# Patient Record
Sex: Female | Born: 1950 | Race: White | Hispanic: No | Marital: Married
Health system: Midwestern US, Community
[De-identification: ages and names within clinical notes are randomized; demographics above are authoritative.]

## PROBLEM LIST (undated history)

## (undated) DIAGNOSIS — G588 Other specified mononeuropathies: Secondary | ICD-10-CM

---

## 2017-04-20 NOTE — Telephone Encounter (Signed)
LM with patient in regard to rescheduling her appt with Dr. Margaretha Glassingonway on 05/30/17 at 1:00pm. Advised patient to call the office back as soon as possible.

## 2017-04-25 NOTE — Telephone Encounter (Signed)
Patient returned my call and rescheduled her appt to 06/05/17 at 2:30pm with Dr. Margaretha Glassingonway.

## 2017-05-30 ENCOUNTER — Encounter: Attending: Obstetrics & Gynecology

## 2017-07-12 ENCOUNTER — Ambulatory Visit: Attending: Obstetrics & Gynecology

## 2017-07-12 ENCOUNTER — Ambulatory Visit: Admit: 2017-07-12 | Discharge: 2017-07-12 | Payer: MEDICARE | Attending: Obstetrics & Gynecology

## 2017-07-12 DIAGNOSIS — G588 Other specified mononeuropathies: Secondary | ICD-10-CM

## 2017-07-12 NOTE — Progress Notes (Signed)
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  Patient was seen by me for consultation in 2016.  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  She had seen Dr. 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  She had multiple injections with no significant benefit.  Her pain started approximately 5 and half years ago.  Onset was somewhat spontaneous.  She notes pain with sitting.  She also notes some intermittent PGAD type symptoms.  Episodes of vaginal pain. History of urinary urgency and frequency which is chronic but increased over the last few years.  She has a chronically low libido and avoids sexual activity both for pain and other reasons.  The patient essentially has not responded to therapy.    Past Medical History:   Diagnosis Date   ??? Anemia    ??? Anxiety    ??? Coccygodynia    ??? Depression    ??? Migraines    ??? OCD (obsessive compulsive disorder)    ??? Pudendal neuralgia        Past Surgical History:   Procedure Laterality Date   ??? HX CORONARY ARTERY BYPASS GRAFT  1998   ??? HX OTHER SURGICAL Right 2018    Right foot big toe joint    ??? HX OTHER SURGICAL      Dental Implants       Physical exam:     General:  The patient appears uncomfortable sitting     Breast:  Deferred     Abdomen:  Soft, flat, nontender, no masses palpated, no hernias noted     Back:  No spinal or sacral tenderness     External genitalia:  Minimal allodynia and point tenderness along the inferior pubic rami, no skin rolling sensitivity   Urethra:  Normal   Vagina:  Positive levator tenderness   Cervix:  Normal   Bimanual:  No masses palpated, no tenderness noted   Rectal:  Coccyx is minimally tender, both sacrospinous ligament complexes are moderately tender with an equivocal Tinel sign     Extremities:  No clubbing cyanosis or edema     Neuro:  Otherwise grossly  intact       Impression:  Patient with continued pudendal neuralgia    Plan:  I recommended the patient follow up with Dr. Esmeralda ArthurBarreveld for series of pudendal nerve blocks as well as to review medical pain management.  We also will consider further physical therapy and continued pudendal protection.   If the patient fails to respond she may have to consider nerve decompression surgery.

## 2017-07-12 NOTE — Progress Notes (Signed)
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  Patient was seen by me for consultation in 2016.  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  She had seen Dr. 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  She had multiple injections with no significant benefit.  Her pain started approximately 5 and half years ago.  Onset was somewhat spontaneous.  She notes pain with sitting.  She also notes some intermittent PGAD type symptoms.  Episodes of vaginal pain. History of urinary urgency and frequency which is chronic but increased over the last few years.  She has a chronically low libido and avoids sexual activity both for pain and other reasons.  The patient essentially has not responded to therapy.    Past Medical History:   Diagnosis Date   ??? Anemia    ??? Anxiety    ??? Coccygodynia    ??? Depression    ??? Migraines    ??? OCD (obsessive compulsive disorder)    ??? Pudendal neuralgia        Past Surgical History:   Procedure Laterality Date   ??? HX CORONARY ARTERY BYPASS GRAFT  1998   ??? HX OTHER SURGICAL Right 2018    Right foot big toe joint    ??? HX OTHER SURGICAL      Dental Implants       Physical exam:     General:  The patient appears uncomfortable sitting     Breast:  Deferred     Abdomen:  Soft, flat, nontender, no masses palpated, no hernias noted     Back:  No spinal or sacral tenderness     External genitalia:  Minimal allodynia and point tenderness along the inferior pubic rami, no skin rolling sensitivity   Urethra:  Normal   Vagina:  Positive levator tenderness   Cervix:  Normal   Bimanual:  No masses palpated, no tenderness noted   Rectal:  Coccyx is minimally tender, both sacrospinous ligament complexes are moderately tender with an equivocal Tinel sign     Extremities:  No clubbing cyanosis or edema      Neuro:  Otherwise grossly intact       Impression:  Patient with continued pudendal neuralgia    Plan:  I recommended the patient follow up with Dr. Esmeralda ArthurBarreveld for series of pudendal nerve blocks as well as to review medical pain management.  We also will consider further physical therapy and continued pudendal protection.   If the patient fails to respond she may have to consider nerve decompression surgery.

## 2017-07-12 NOTE — Patient Instructions (Signed)
Follow-up with Dr. Esmeralda ArthurBarreveld for pudendal nerve blocks as well as to consider medical pain management.  We may have to revisit physical therapy.

## 2017-09-04 NOTE — Telephone Encounter (Signed)
Pt calling states she was told by Dr Margaretha Glassingonway to schedule nerve block with Dr Esmeralda ArthurBarreveld. She was told she already had a diagnostic nerve block that was negative. Pt does not recall this. She would like to know what her next step should be.

## 2017-09-22 NOTE — Telephone Encounter (Signed)
I called the patient and left her a voicemail stating that I was trying to reach out to Dr. Esmeralda ArthurBarreveld to review the case.  Once  we have reviewed this we will make a plan for  your treatment   Going forward.

## 2017-12-18 ENCOUNTER — Encounter

## 2021-05-03 IMAGING — CT CT RIGHT FOOT WITHOUT CONTRAST
3 of 4 series · 13 of 35 positions shown, 16 images · non-contrast
Comparison: None

________________________________________________________________________________________________ 
CT RIGHT FOOT WITHOUT CONTRAST, 05/03/2021 [DATE]: 
CLINICAL INDICATION: Pain in right foot. Evaluate hallux. 
A search for DICOM formatted images was conducted for prior CT imaging studies 
completed at a non-affiliated media free facility.
TECHNIQUE: The right foot was scanned without contrast on a high resolution low 
dose CT scanner. Standard reconstructions were performed.

[Series 3: axial st · axial · 0.29mm/px · z∈[-212,-42]mm · 5 of 512 slices shown, 7 images]
[im 86/512  soft-tissue]
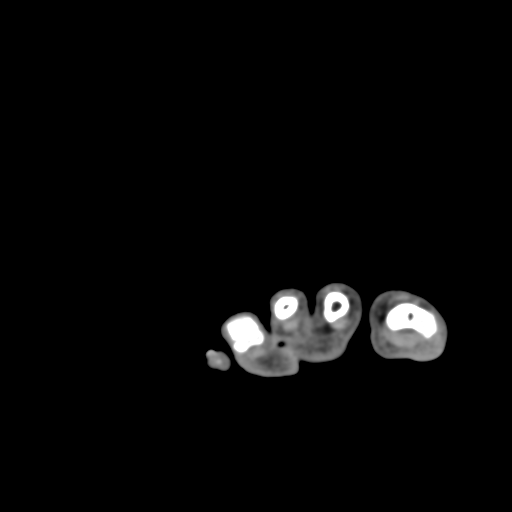
[im 86/512  bone]
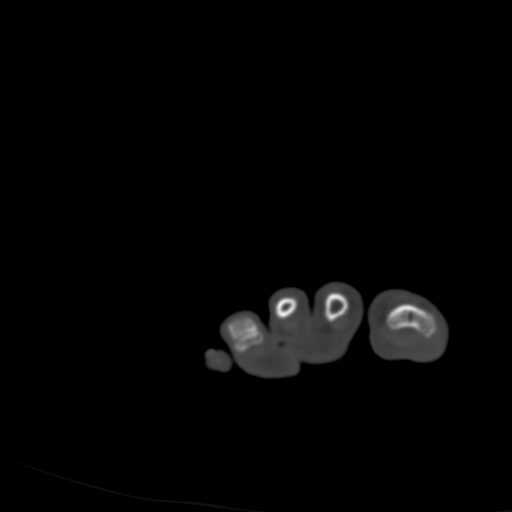
[im 171/512  bone]
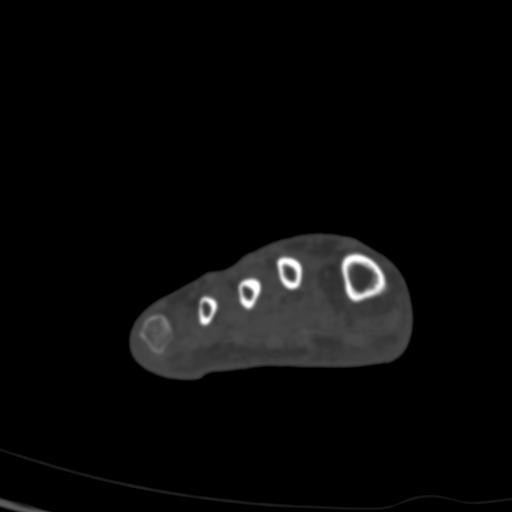
[im 256/512  bone]
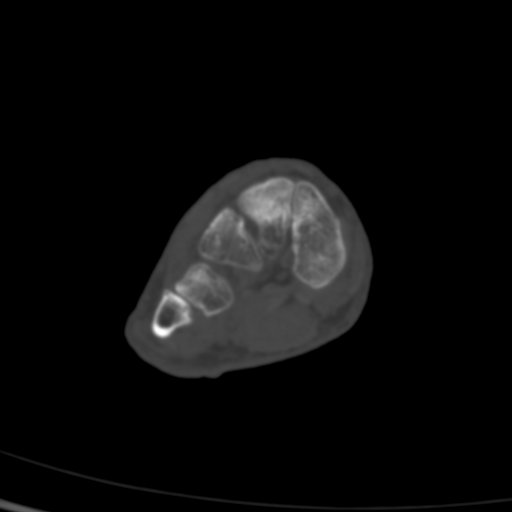
[im 341/512  bone]
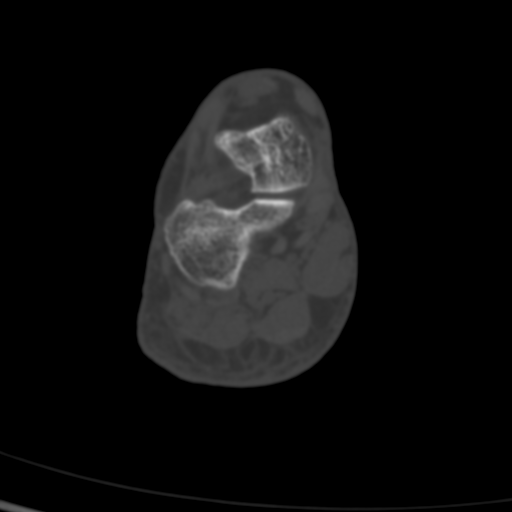
[im 426/512  soft-tissue]
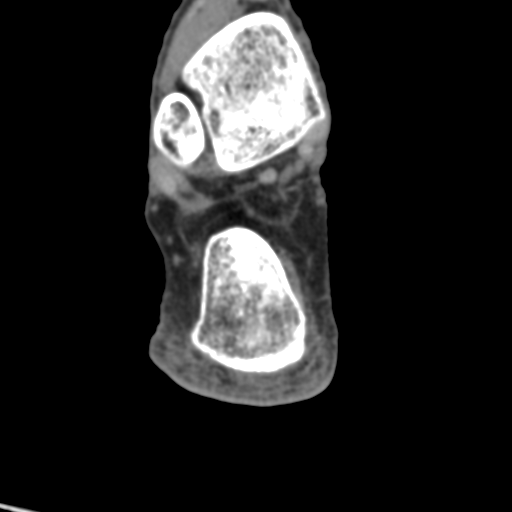
[im 426/512  bone]
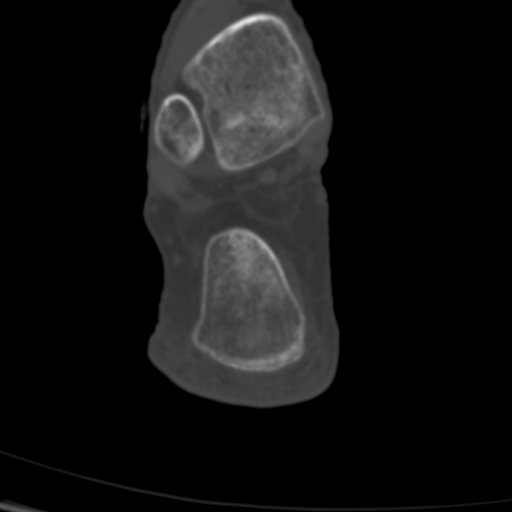

[Series 5: cor bone · coronal · 0.29mm/px · 3 of 154 slices shown]
[im 31/154  bone]
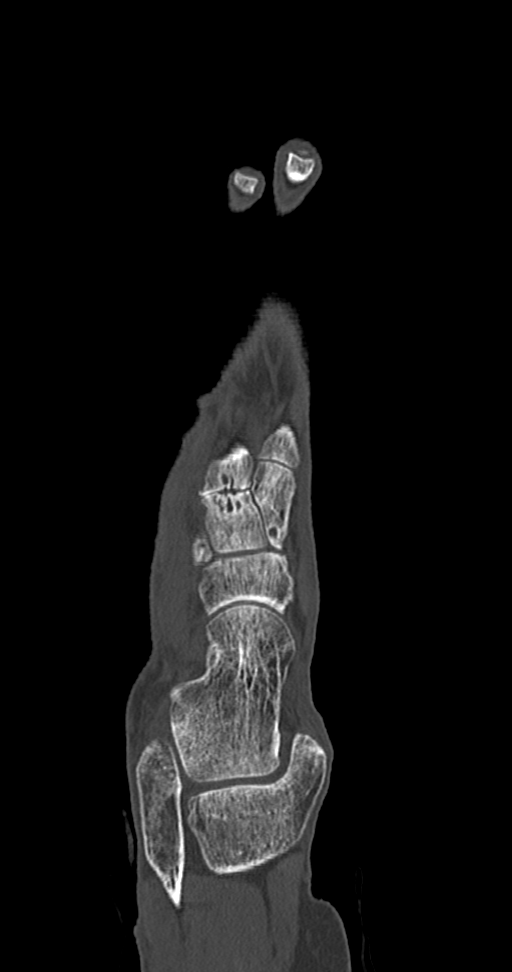
[im 62/154  bone]
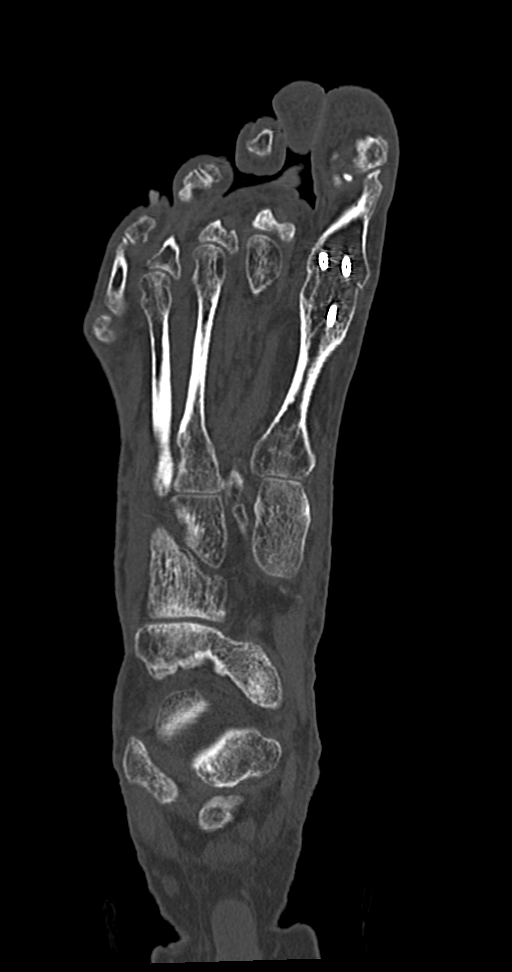
[im 92/154  bone]
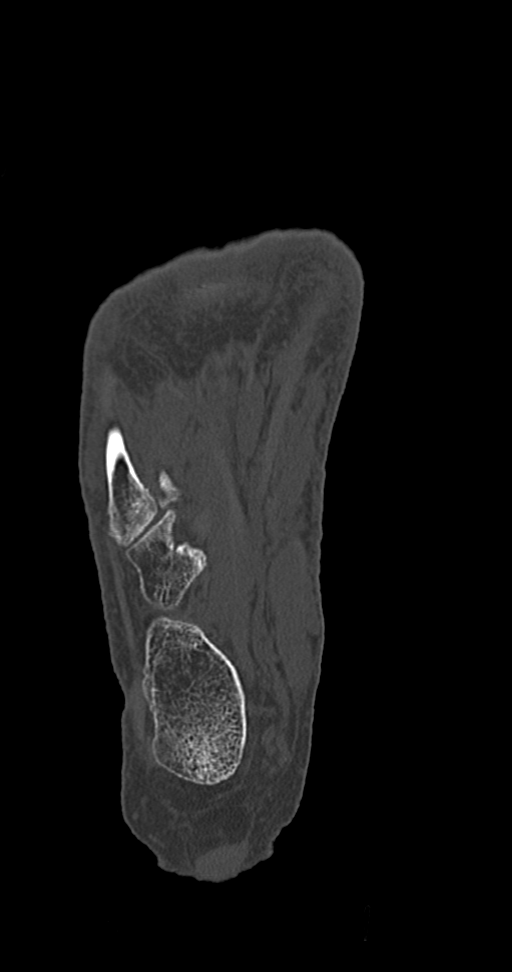

[Series 602: rt sag · sagittal · 0.50mm/px · 5 of 89 slices shown, 6 images]
[im 30/89  bone]
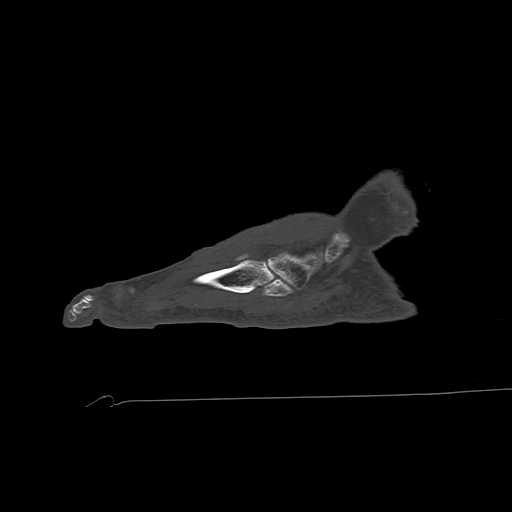
[im 37/89  bone]
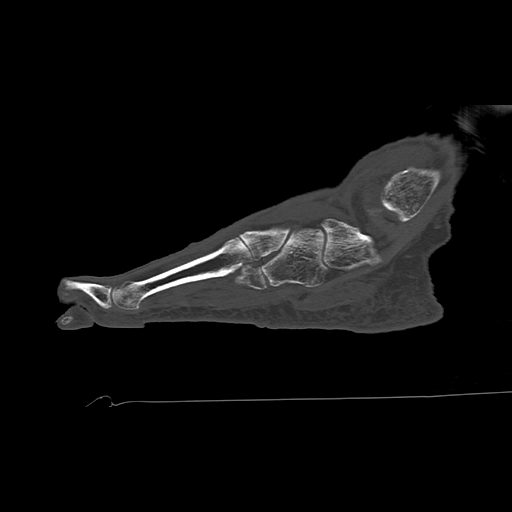
[im 45/89  soft-tissue]
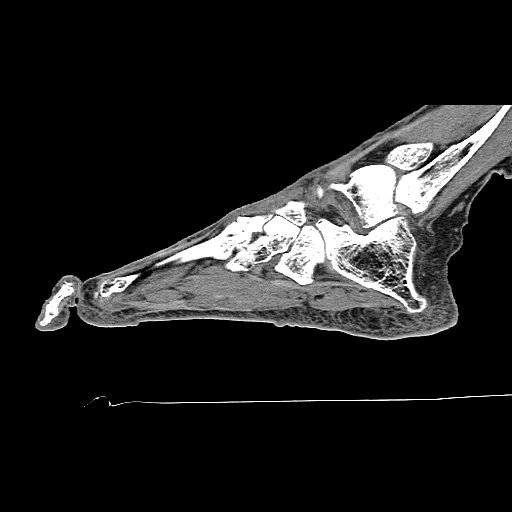
[im 45/89  bone]
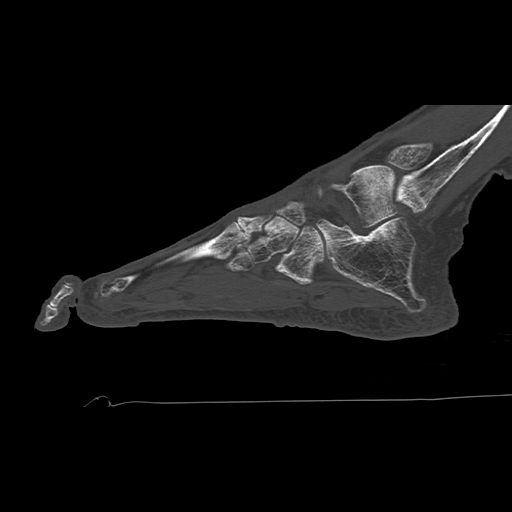
[im 52/89  bone]
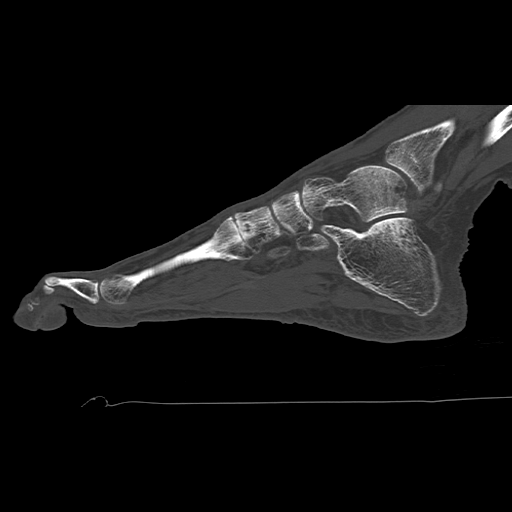
[im 59/89  bone]
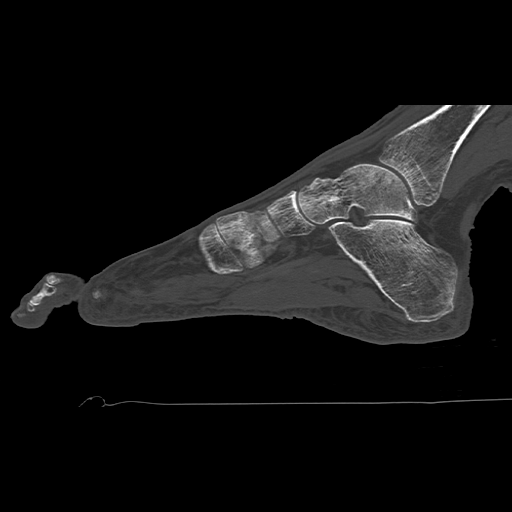

[13 of 35 positions shown; findings below may reference images not displayed]

FINDINGS: BONES: No fractures or CT evidence of osteomyelitis.  
JOINTS: Ankle and subtalar joints are preserved. Marked degenerative change of 
the second tarsometatarsal (TMT) joint with marked joint space narrowing and 
subcortical cysts. Moderate third and mild fourth TMT joint degenerative change. 
Subcortical cystic change of the proximal medial cuneiform. Hallux 
metatarsophalangeal (MTP) joint solid arthrodesis with 2 fixation screws. No 
hardware loosening. Degenerative change of the articulation between the hallux 
metatarsal head and tibial hallux sesamoid. Remaining MTP joints are preserved. 
Flexion of the lesser toes on nonweightbearing views. No interphalangeal (IP) 
joint space narrowing. 
SOFT TISSUES: Tiny calcifications within the distal Achilles tendon. No mass or 
fluid collections. No full-thickness tendon tear and tendon subluxation. 
Subcutaneous soft tissues are normal.
IMPRESSION: 1.  Hallux MTP joint solid arthrodesis with 2 fixation screws. 
2.  Multifocal degenerative change, most marked involving the second TMT joint.  
RADIATION DOSE REDUCTION: All CT scans are performed using radiation dose 
reduction techniques, when applicable. Technical factors are evaluated and 
adjusted to ensure appropriate moderation of exposure. Automated dose management 
technology is applied to adjust the radiation doses to minimize exposure while 
achieving diagnostic quality images.

## 2021-06-11 IMAGING — DX CERVICAL SPINE 4 VIEWS
1 series · 4 of 4 positions shown · non-contrast
Comparison: None

________________________________________________________________________________________________ 
CERVICAL SPINE 4 VIEWS, 06/11/2021 [DATE]: 
CLINICAL INDICATION: Intermittent left neck pain.

[Series 1: lateral · U · 0.14mm/px · 4 of 4 slices shown]
[im 1/4]
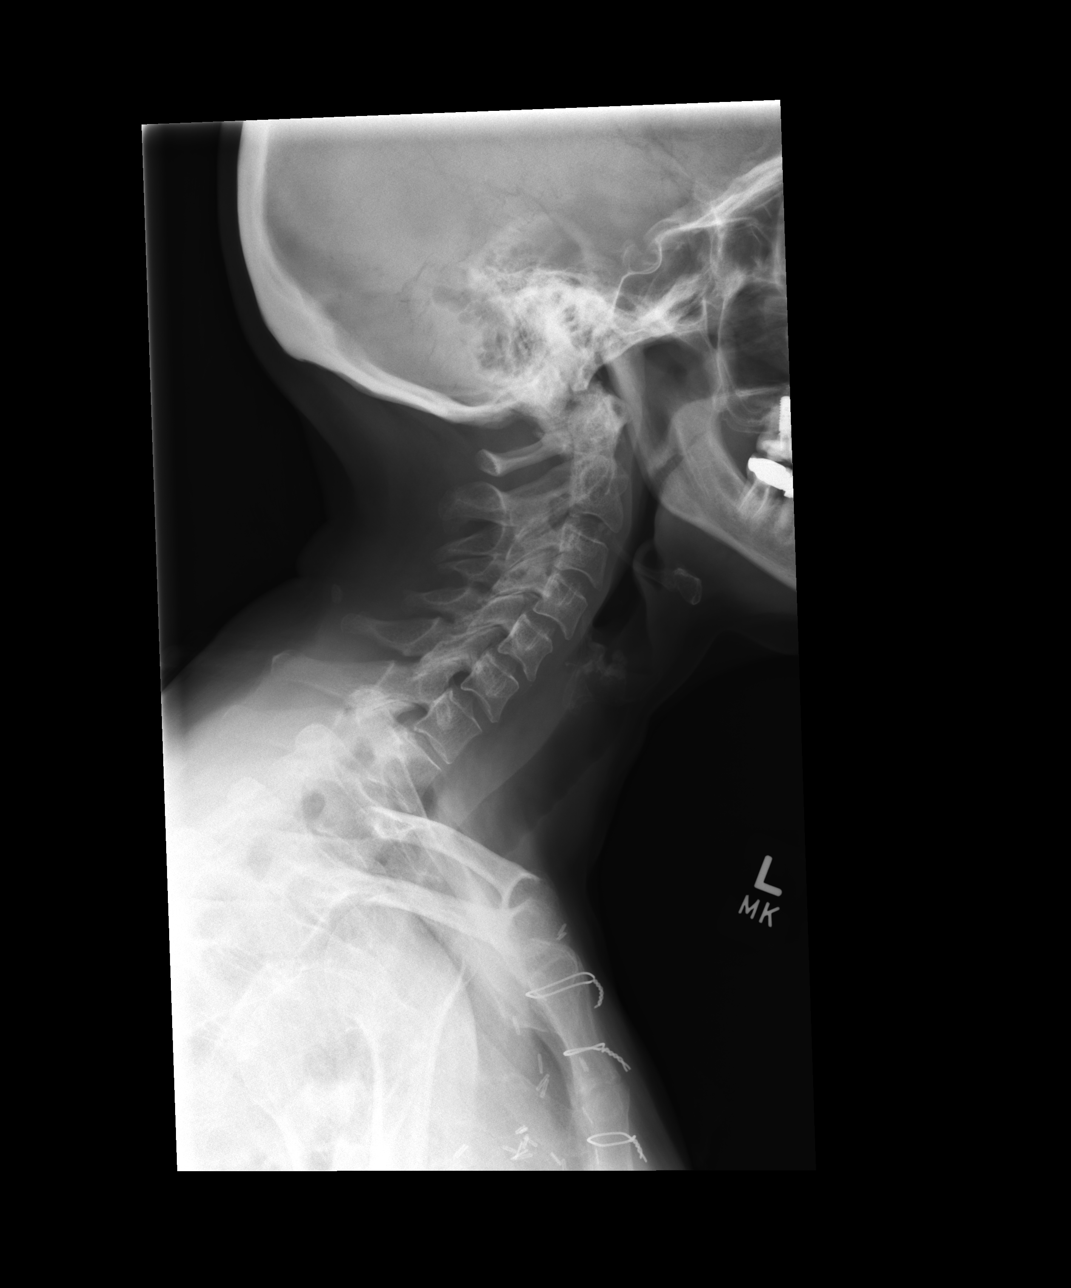
[im 2/4]
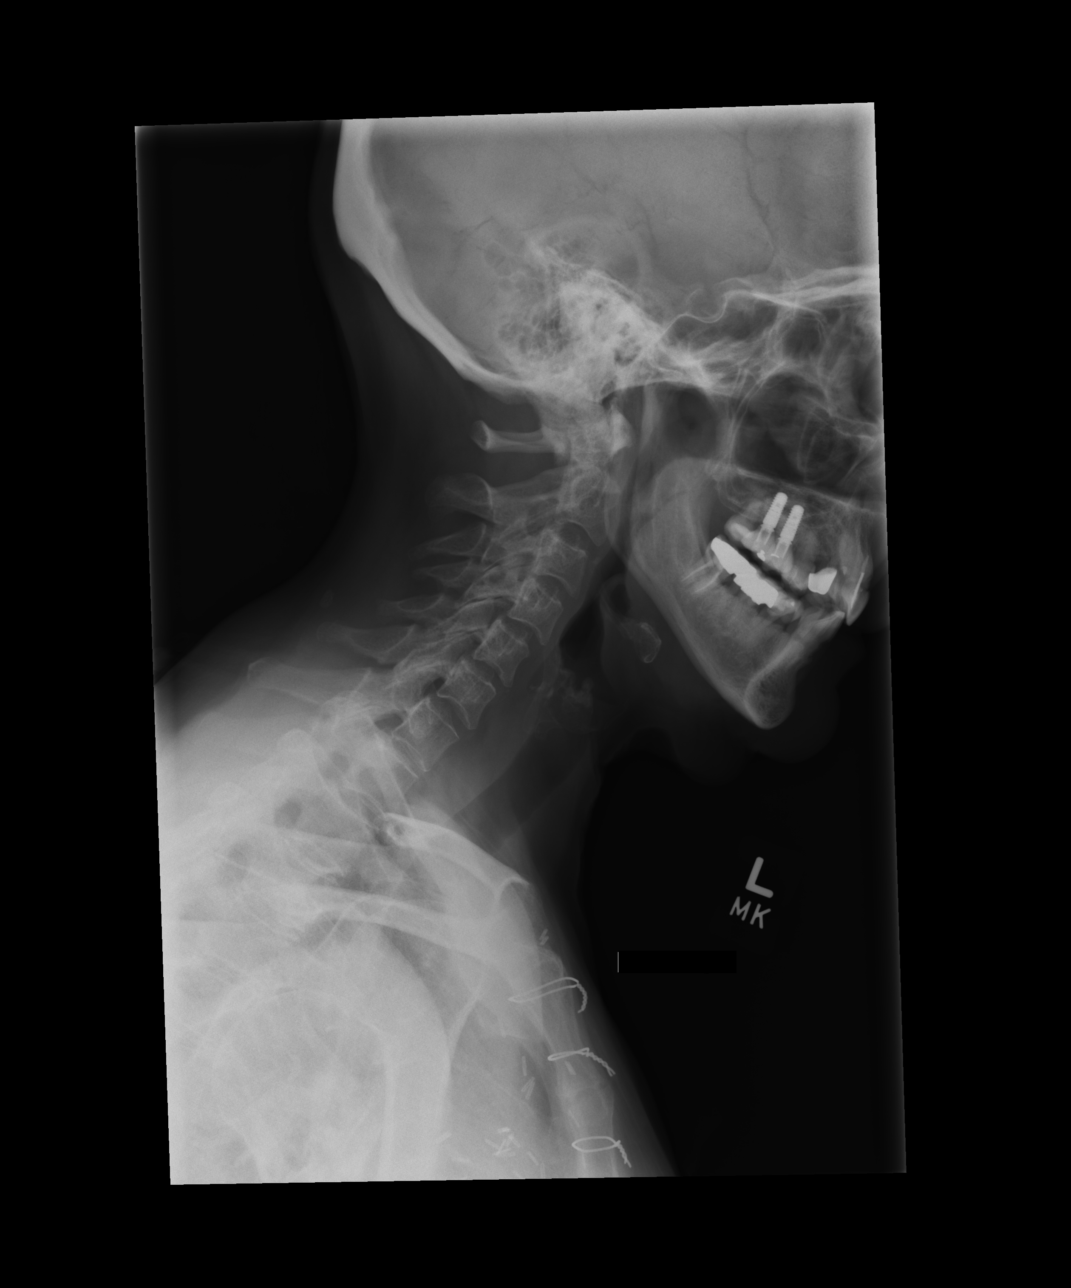
[im 3/4]
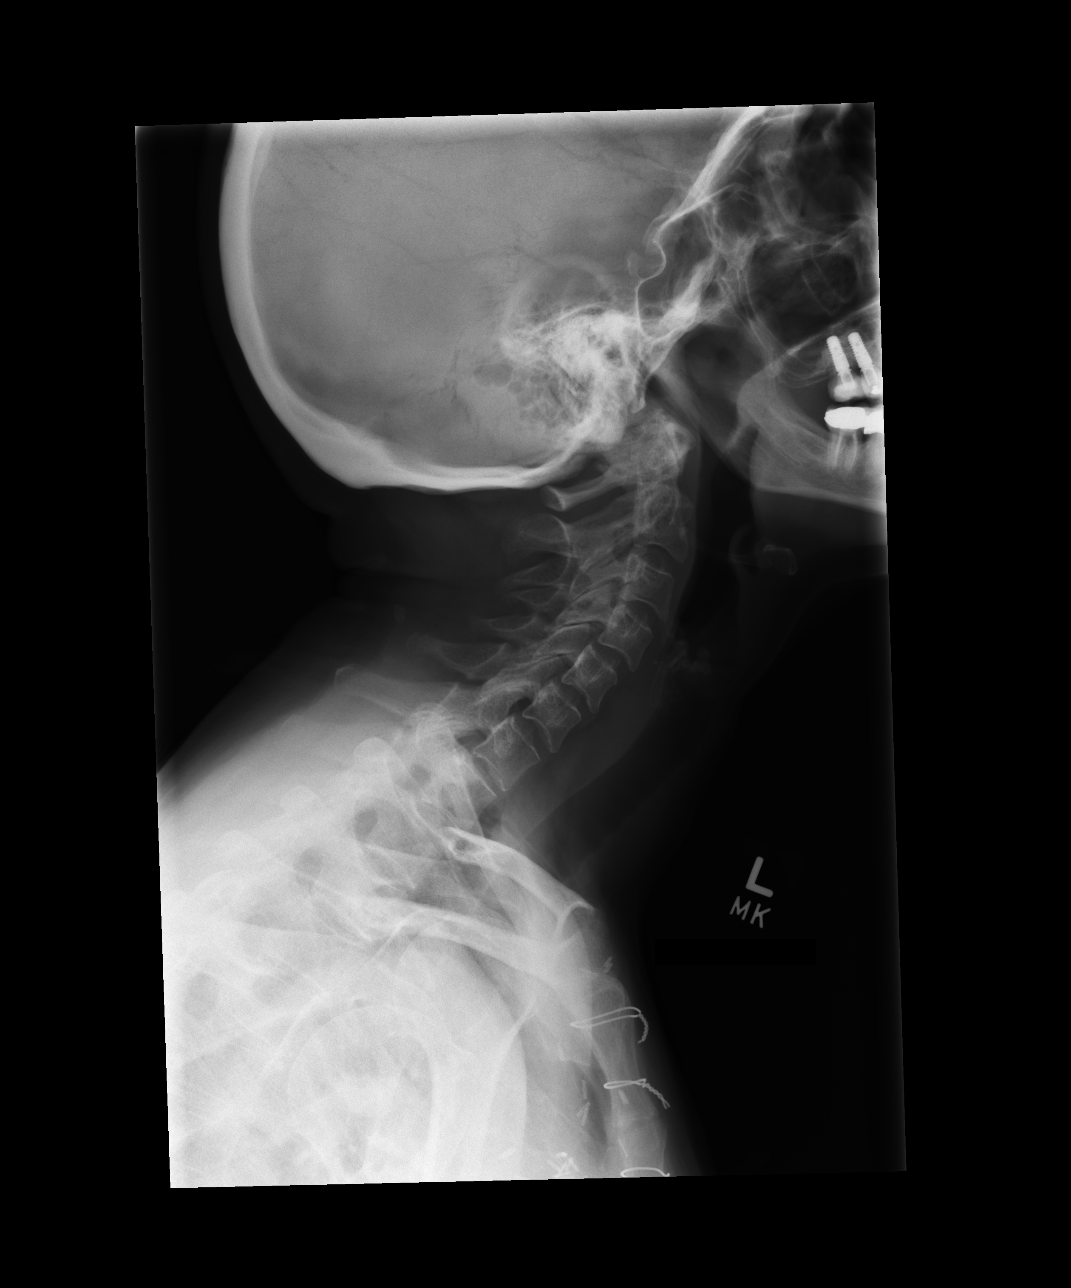
[im 4/4]
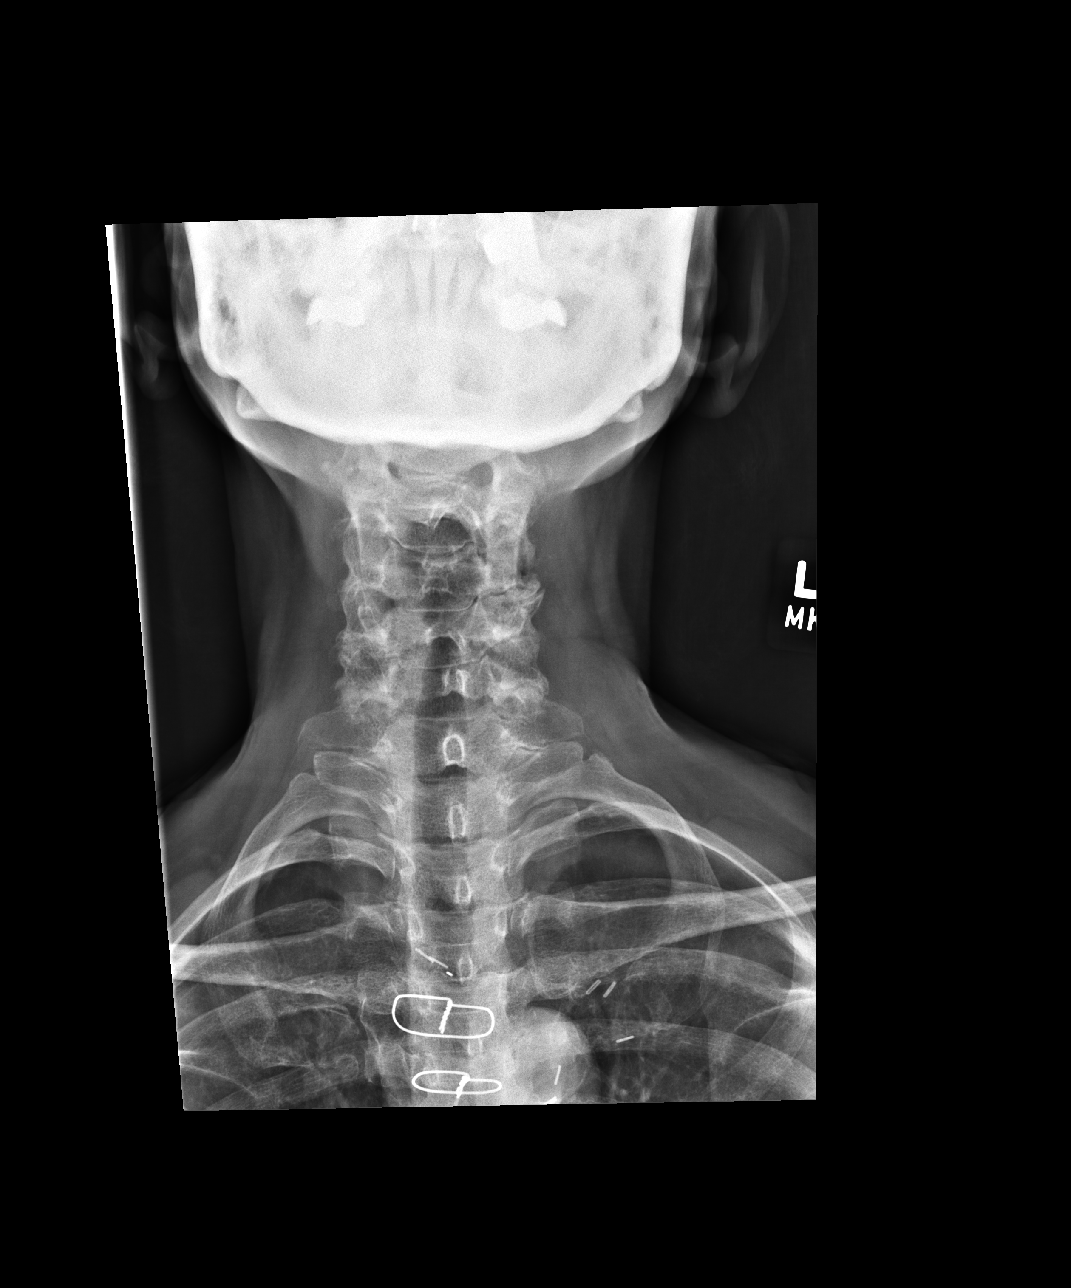

[4 of 4 positions shown; findings below may reference images not displayed]

FINDINGS: Facet hypertrophy left greater than right. Flexion and extension views 
do not reveal subluxation. Disc space heights are well-maintained. Prevertebral 
soft tissues are within normal limits.
IMPRESSION: Facet hypertrophy left greater than right.

## 2021-06-15 IMAGING — MR MRI CERVICAL SPINE WITHOUT CONTRAST
5 of 6 series · 24 of 48 positions shown · IV contrast (gadolinium)
Comparison: X-ray cervical spine from June 11, 2021.

________________________________________________________________________________________________ 
MRI CERVICAL SPINE WITHOUT CONTRAST, 06/15/2021 [DATE]: 
CLINICAL INDICATION: Spondylosis without myelopathy or radiculopathy, cervical 
region.
TECHNIQUE: Multiplanar, multiecho position MR images of the cervical spine were 
performed without intravenous gadolinium enhancement. Patient was scanned on a 
1.5T magnet.

[Series 201: survey* · axial · 10.0mm · 1.56mm/px · z∈[+26,+252]mm · 6 of 15 slices shown]
[im 1/15]
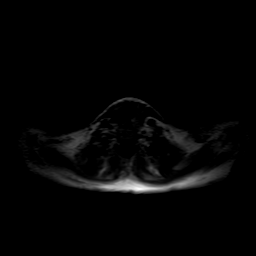
[im 3/15]
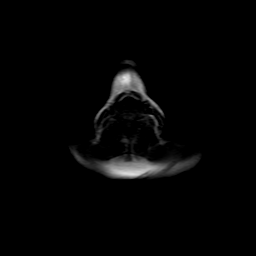
[im 6/15]
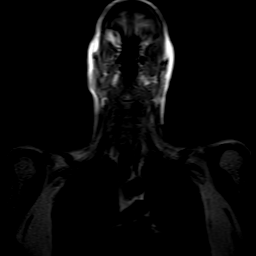
[im 9/15]
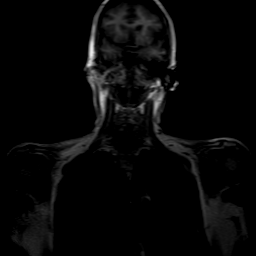
[im 12/15]
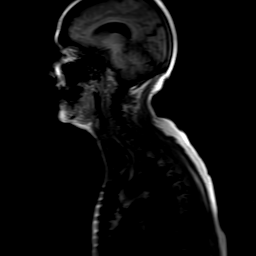
[im 15/15]
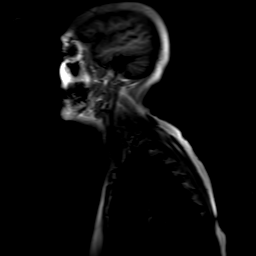

[Series 301: t2w_cor-surv · coronal · 5.0mm · 0.85mm/px · 5 of 12 slices shown]
[im 1/12]
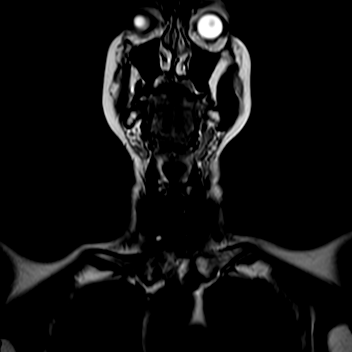
[im 3/12]
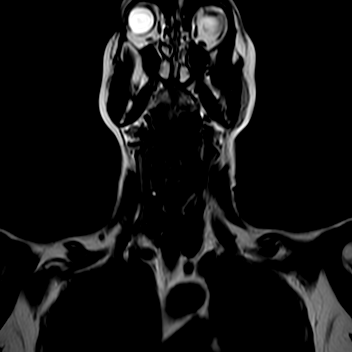
[im 6/12]
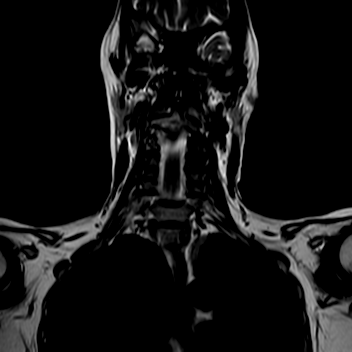
[im 9/12]
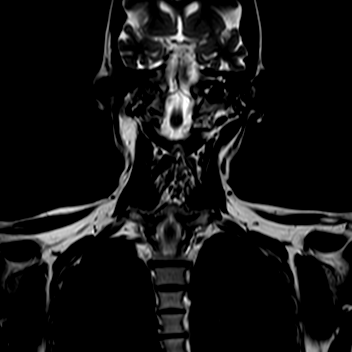
[im 12/12]
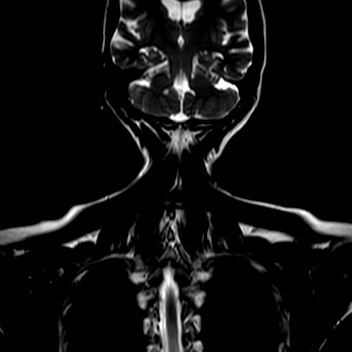

[Series 401: t1_sag · sagittal · 3.0mm · 0.34mm/px · 6 of 15 slices shown]
[im 1/15]
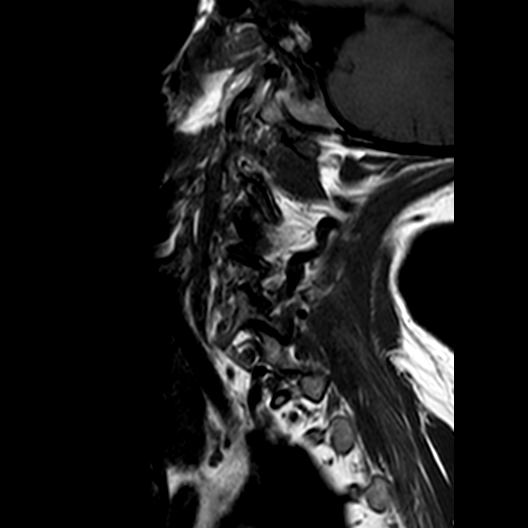
[im 3/15]
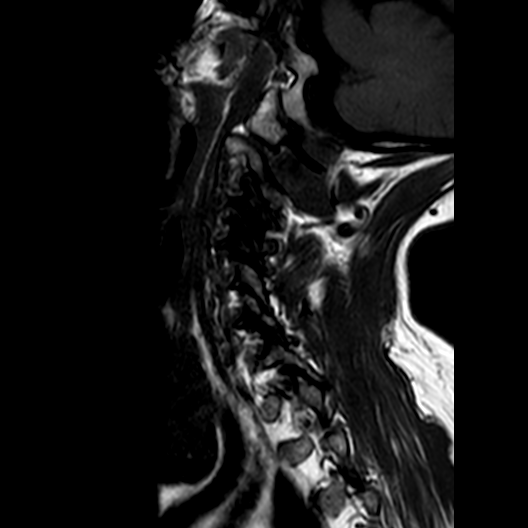
[im 6/15]
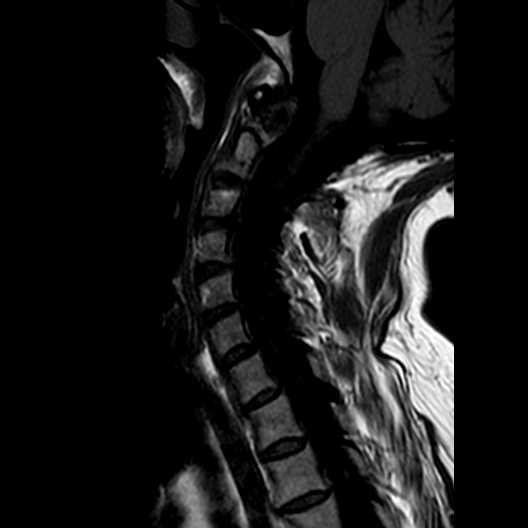
[im 9/15]
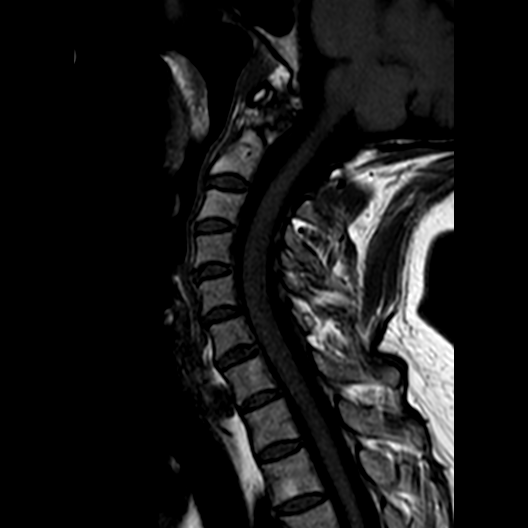
[im 12/15]
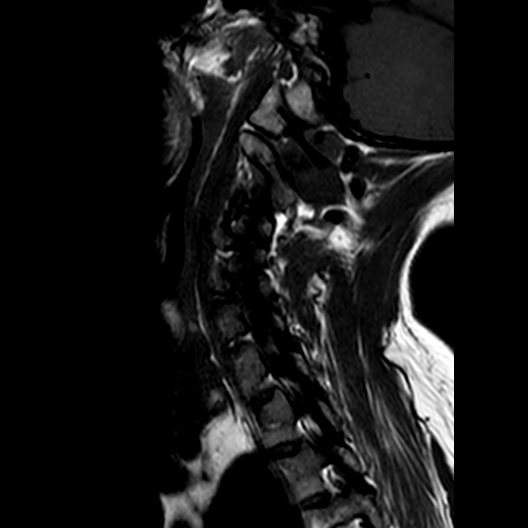
[im 15/15]
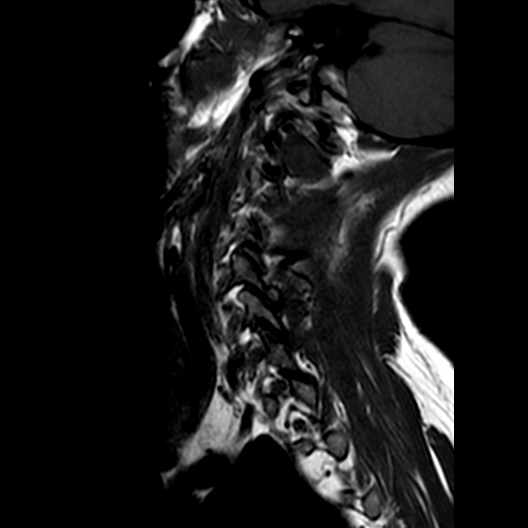

[Series 501: t2w_mv_xd_sag · sagittal · 3.0mm · 0.31mm/px · 6 of 15 slices shown]
[im 1/15]
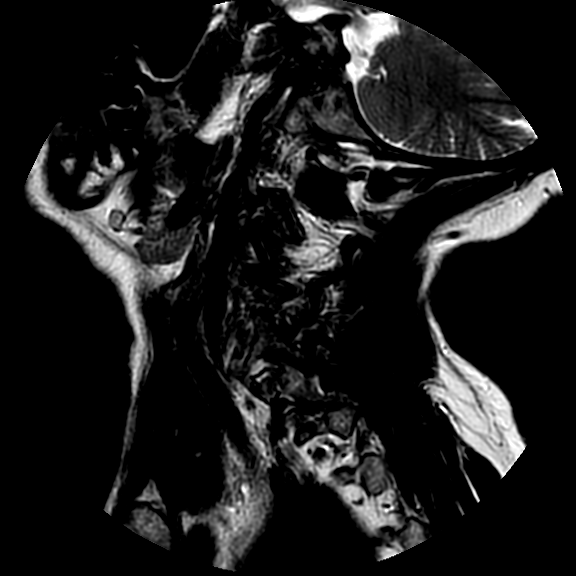
[im 3/15]
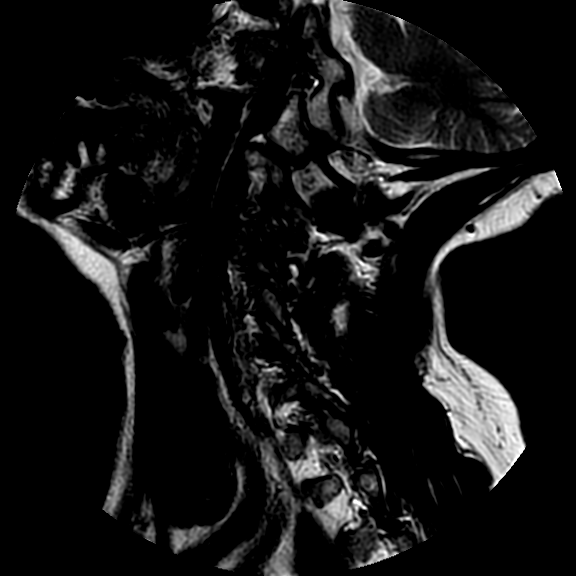
[im 6/15]
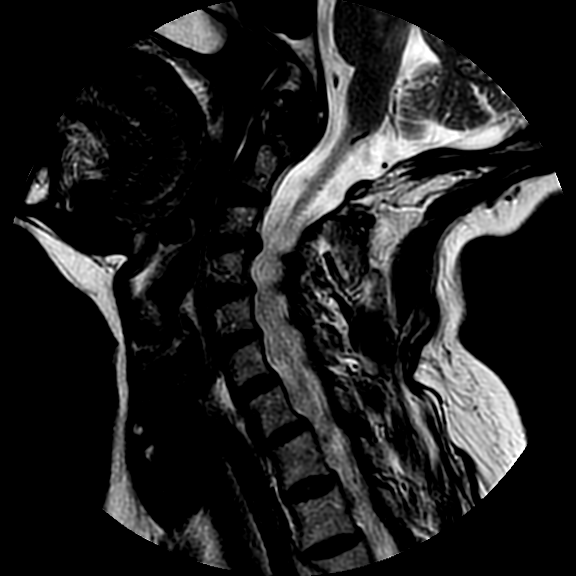
[im 9/15]
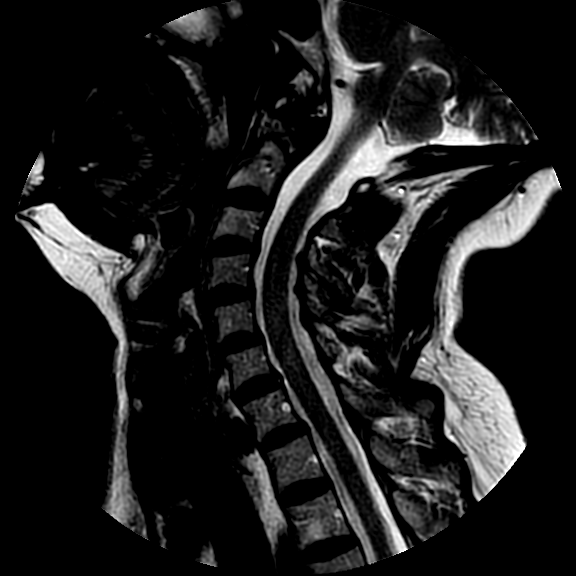
[im 12/15]
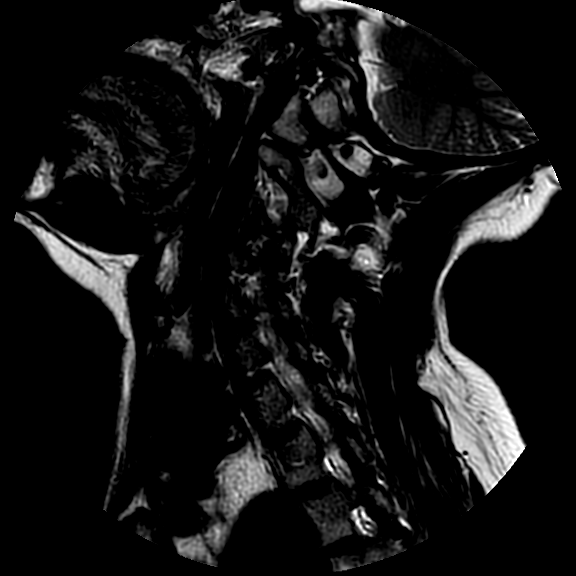
[im 15/15]
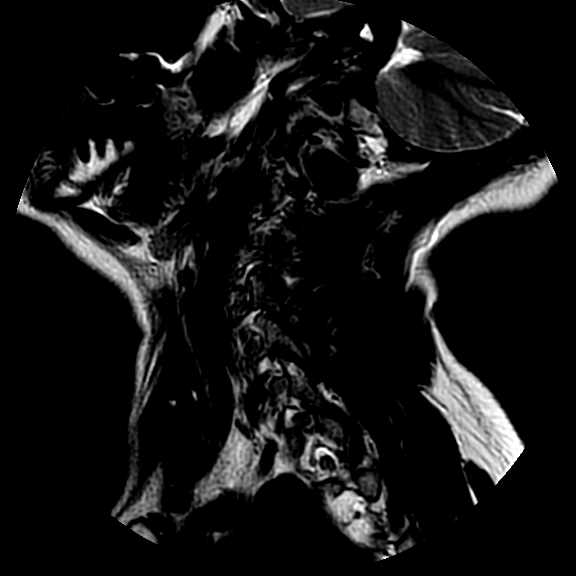

[Series 601: stir_sag · sagittal · 3.0mm · 0.42mm/px · 1 of 15 slices shown]
[im 1/15]
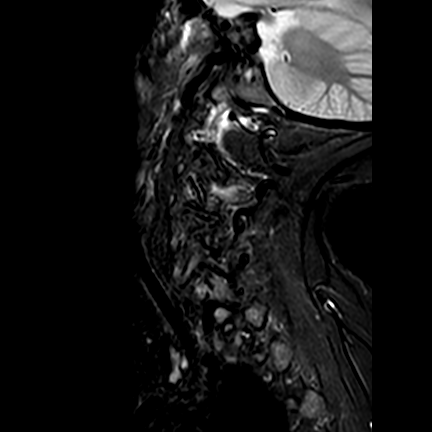

[24 of 48 positions shown; findings below may reference images not displayed]

FINDINGS: -------------------------------------------------------------------------------- 
----------------- 
GENERAL: 
ALIGNMENT: Minimal anterolisthesis of C4 on C5, C5 on C6, C6 on C7, C7 on T1. 
VERTEBRAL BODY HEIGHT: Normal.  
MARROW SIGNAL: Very mild T2 prolongation along the right C6-C7 facet joint from 
mild active degenerative change. 
CORD SIGNAL: Normal.  
ADDITIONAL FINDINGS: Subcentimeter thyroid nodules, largest in the right lobe 
measuring up to 9 mm. Increased size of the thyroid gland. 
-------------------------------------------------------------------------------- 
---------------- 
SEGMENTAL: 
CRANIOCERVICAL JUNCTION: Degenerative change at the odontoid process-8W 
articulation without significant stenosis. 
C2-C3: Left facet hypertrophy. No significant central canal narrowing. Mild left 
neural foraminal narrowing. 
C3-C4: Disc osteophyte complex with small central disc herniation. Left 
uncovertebral joint hypertrophy. Left facet hypertrophy. No significant central 
canal narrowing. No significant right neural foraminal narrowing. Moderate left 
neural foraminal narrowing. 
C4-C5: Left facet hypertrophy. Mild left uncovertebral joint hypertrophy. 
Shallow central disc herniation. No significant central canal or right neural 
foraminal narrowing. Mild left neural foraminal narrowing. 
C5-C6: Right uncovertebral joint hypertrophy. Right facet hypertrophy. No 
significant central canal or neural foraminal narrowing.  
C6-C7: Disc osteophyte complex with mild bilateral facet hypertrophy. No 
significant central canal or neural foraminal narrowing. 
C7-T1: Mild bilateral facet hypertrophy. Uncovering of the disc space. No 
significant central canal or neural foraminal narrowing.  
-------------------------------------------------------------------------------- 
---------------
IMPRESSION: 1.  Discogenic/degenerative changes as above. 
2.  No moderate or severe central canal narrowing at any level.  
3.  Worst level(s) of neural foraminal narrowing: C3-C4 (moderate left neural 
foraminal narrowing). 
4.  Mild active degenerative change along the right C6-C7 facet joint which may 
result in local pain. 
5.  Increased size of thyroid gland.

## 2022-01-23 IMAGING — MR MRI PELVIS WITHOUT CONTRAST
4 of 7 series · 14 of 48 positions shown · IV contrast (gadolinium)
Comparison: None

________________________________________________________________________________________________ 
MRI PELVIS WITHOUT CONTRAST, 01/23/2022 [DATE]: 
CLINICAL INDICATION: Sacrococcygeal disorders, not elsewhere classified. 
Posterior pelvic pain, mainly coccyx area for 14 years.
TECHNIQUE: Multiplanar, multiecho position MR images of the pelvis were 
performed without intravenous gadolinium enhancement. Patient was scanned on a 
3T magnet.

[Series 201: survey · axial · 15.0mm · 1.76mm/px · z∈[-184,+138]mm · 3 of 11 slices shown]
[im 1/11]
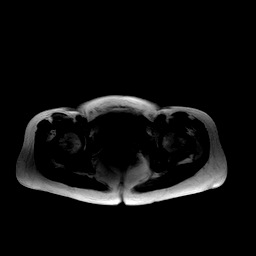
[im 6/11]
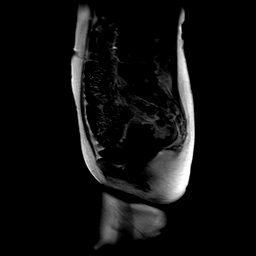
[im 11/11]
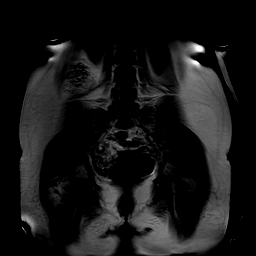

[Series 301: t1w_tse ax · axial · 5.0mm · 0.52mm/px · z∈[-292,-65]mm · 5 of 43 slices shown]
[im 1/43]
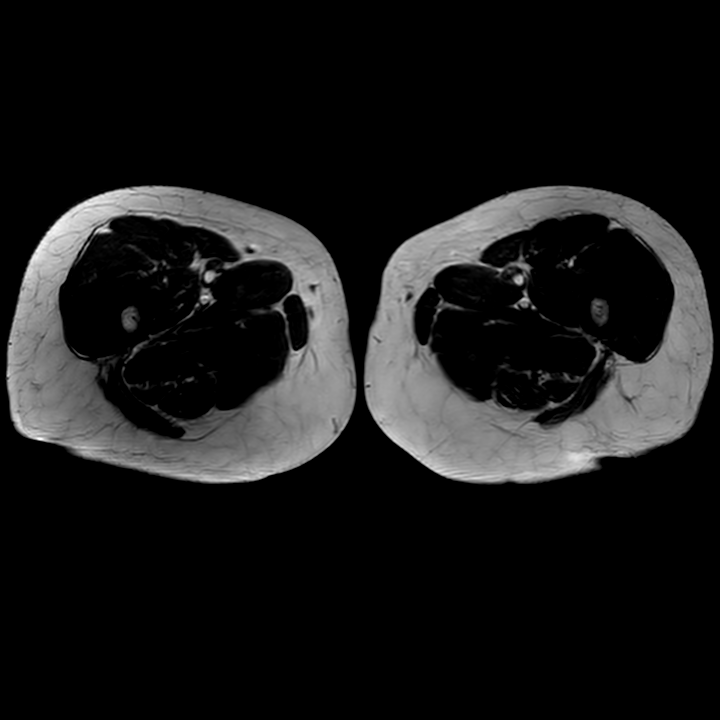
[im 7/43]
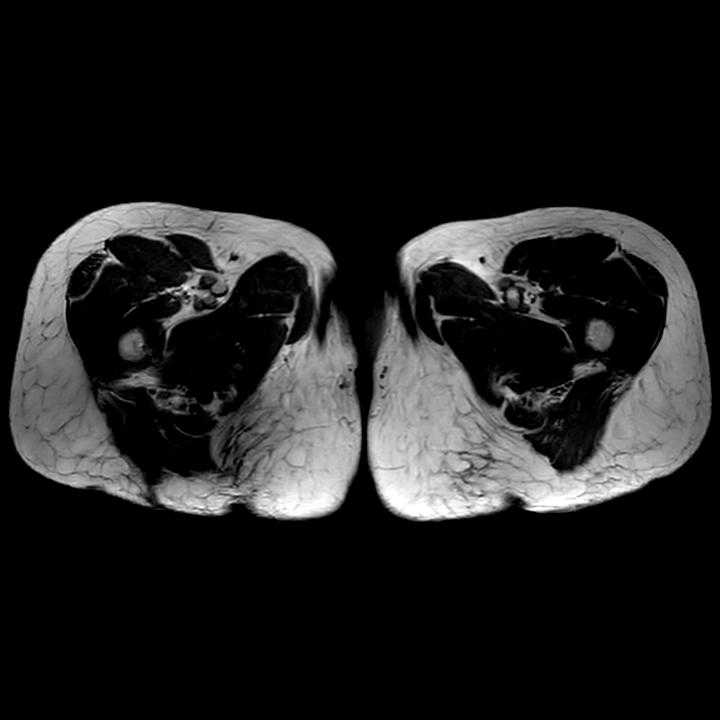
[im 13/43]
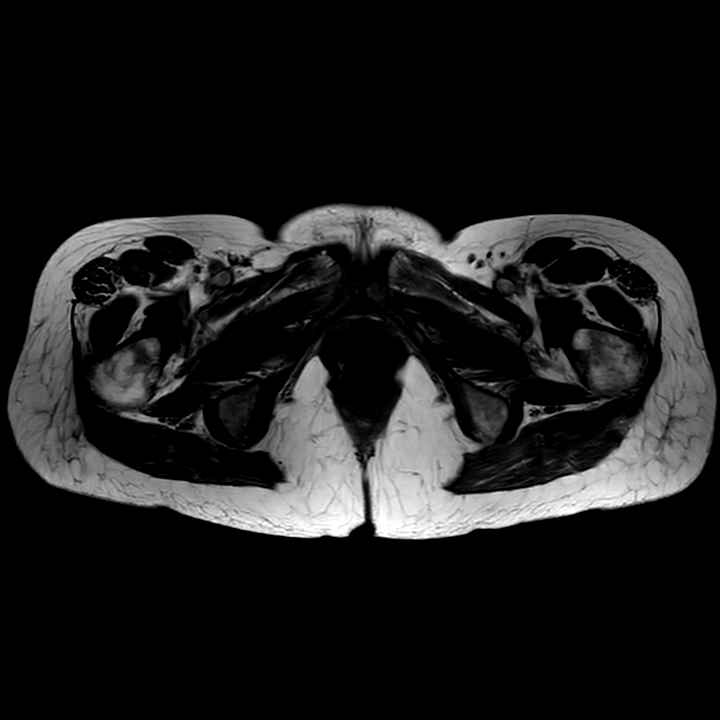
[im 25/43]
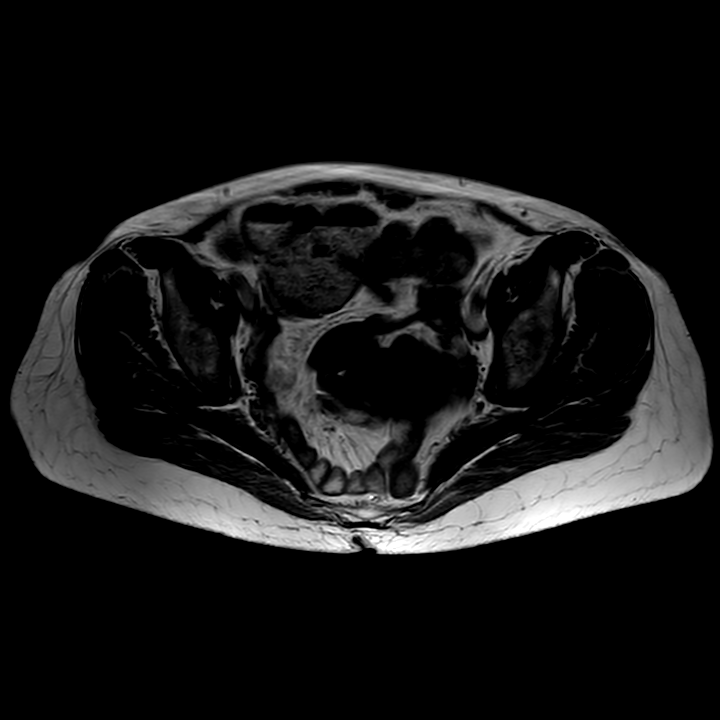
[im 37/43]
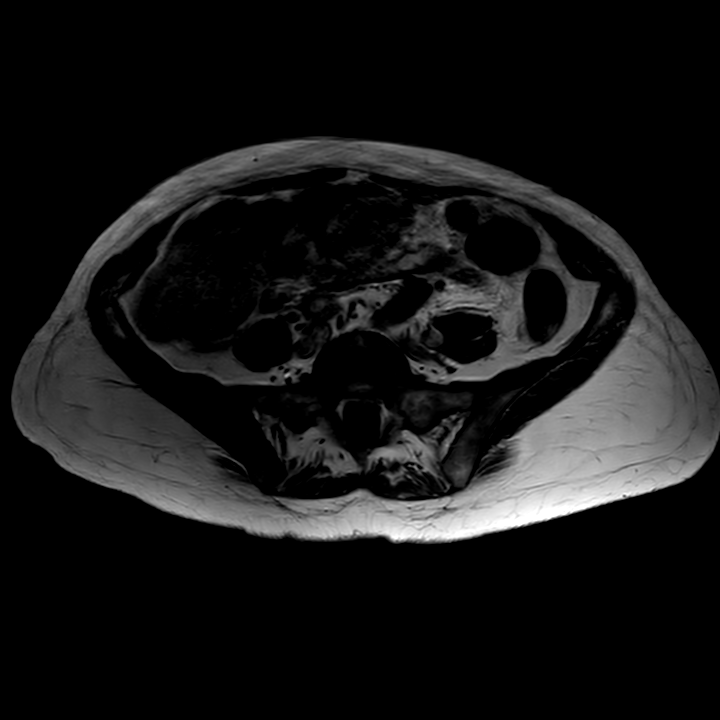

[Series 401: PD fat-sat · axial · 5.0mm · 0.74mm/px · z∈[-254,-65]mm · 3 of 43 slices shown]
[im 7/43]
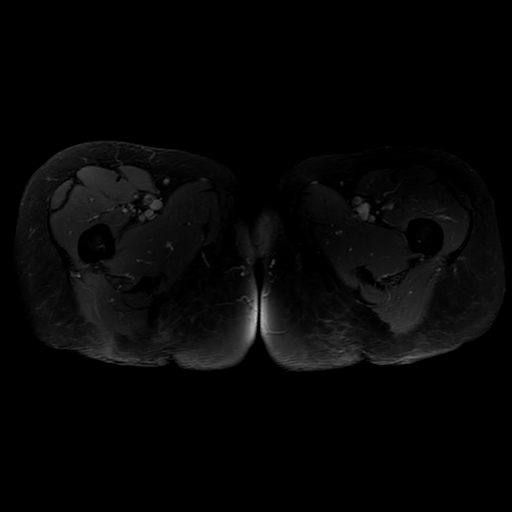
[im 25/43]
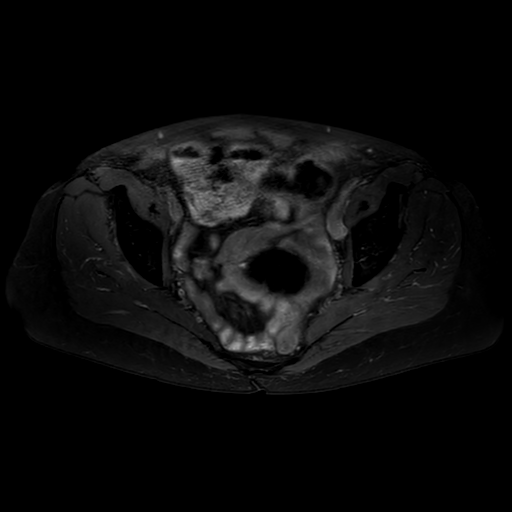
[im 37/43]
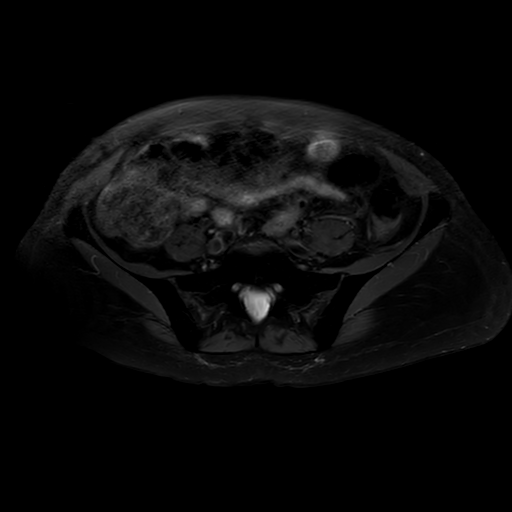

[Series 501: t1w_tse_cor · coronal · 5.0mm · 0.56mm/px · 3 of 36 slices shown]
[im 6/36]
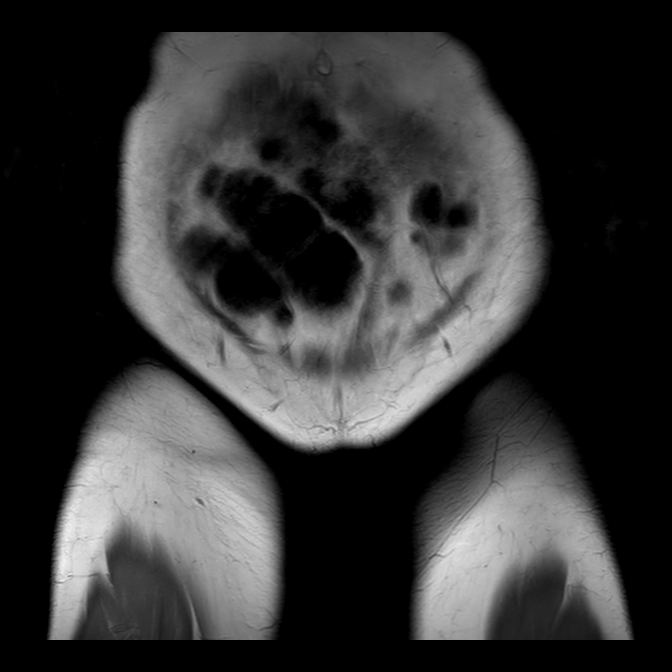
[im 18/36]
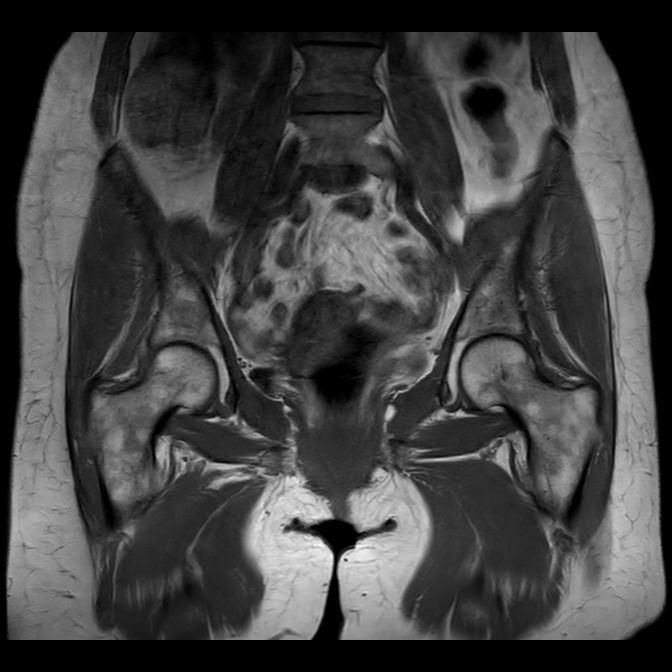
[im 30/36]
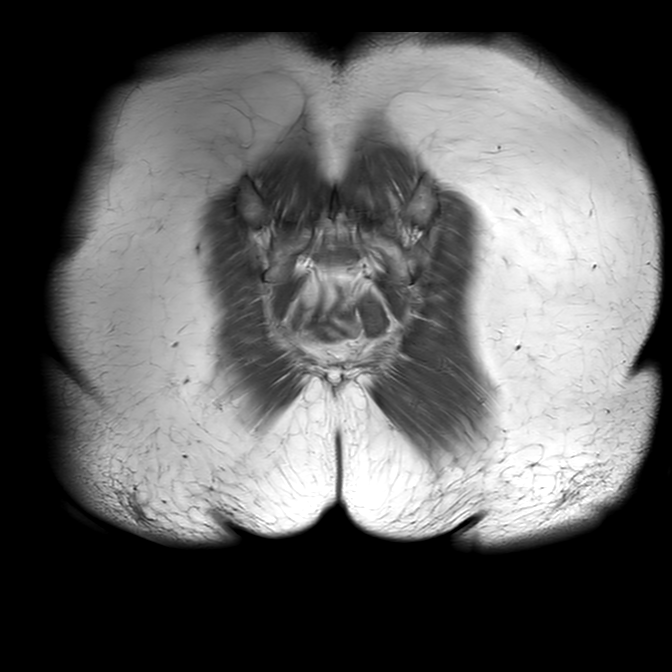

[14 of 48 positions shown; findings below may reference images not displayed]

FINDINGS: HIPS:  Mild articular cartilaginous loss both hips. No labral tear. No 
paralabral cyst. No hip joint effusion. Both femoral heads maintain a spherical 
configuration without evidence of avascular necrosis or subarticular collapse. 
No abnormal morphology of the proximal femurs or acetabulum to predispose to 
impingement  
BONES: Normal marrow signal intensity of the proximal hips, pelvis, sacrum and 
included lower lumbar spine. No fracture, contusion or marrow replacing lesion. 
Included lumbar spine demonstrates multilevel degenerative change. SI joints 
show mild degenerative change. No marrow signal abnormality within the sacrum or 
coccyx. Normal sacrococcygeal alignment. 
SOFT TISSUES: The bilateral abductor cuffs are preserved. The insertions of the 
iliopsoas tendons are intact. The origins of the hamstrings are preserved. 
Rectus abdominis-adductor complex is preserved. The musculature is symmetric 
without strain, atrophy or mass. No focal fluid collection or distended bursa. 
Specifically, no iliopsoas or trochanteric bursitis. Included neurovascular 
bundles are negative. Intrapelvic contents  included gas within the rectum.
IMPRESSION: Mild degenerative changes including both hips.
